# Patient Record
Sex: Male | Born: 1935 | Race: White | Hispanic: No | Marital: Married | Smoking: Former smoker
Health system: Southern US, Community
[De-identification: ages and names within clinical notes are randomized; demographics above are authoritative.]

## PROBLEM LIST (undated history)

## (undated) DIAGNOSIS — E119 Type 2 diabetes mellitus without complications: Secondary | ICD-10-CM

## (undated) DIAGNOSIS — F0390 Unspecified dementia without behavioral disturbance: Secondary | ICD-10-CM

## (undated) DIAGNOSIS — R001 Bradycardia, unspecified: Secondary | ICD-10-CM

## (undated) HISTORY — PX: PACEMAKER INSERTION: SHX728

## (undated) HISTORY — PX: CORONARY ARTERY BYPASS GRAFT: SHX141

---

## 2010-08-09 ENCOUNTER — Ambulatory Visit: Payer: Self-pay | Admitting: Cardiology

## 2010-08-12 ENCOUNTER — Ambulatory Visit: Payer: Self-pay | Admitting: Cardiology

## 2011-07-26 IMAGING — CR DG CHEST 2V
1 series · 2 of 2 positions shown · non-contrast
Comparison: none

REASON FOR EXAM: cad
COMMENTS:

[Series 1: view not recorded · 0.17mm/px · 2 of 2 slices shown]
[im 1/2]
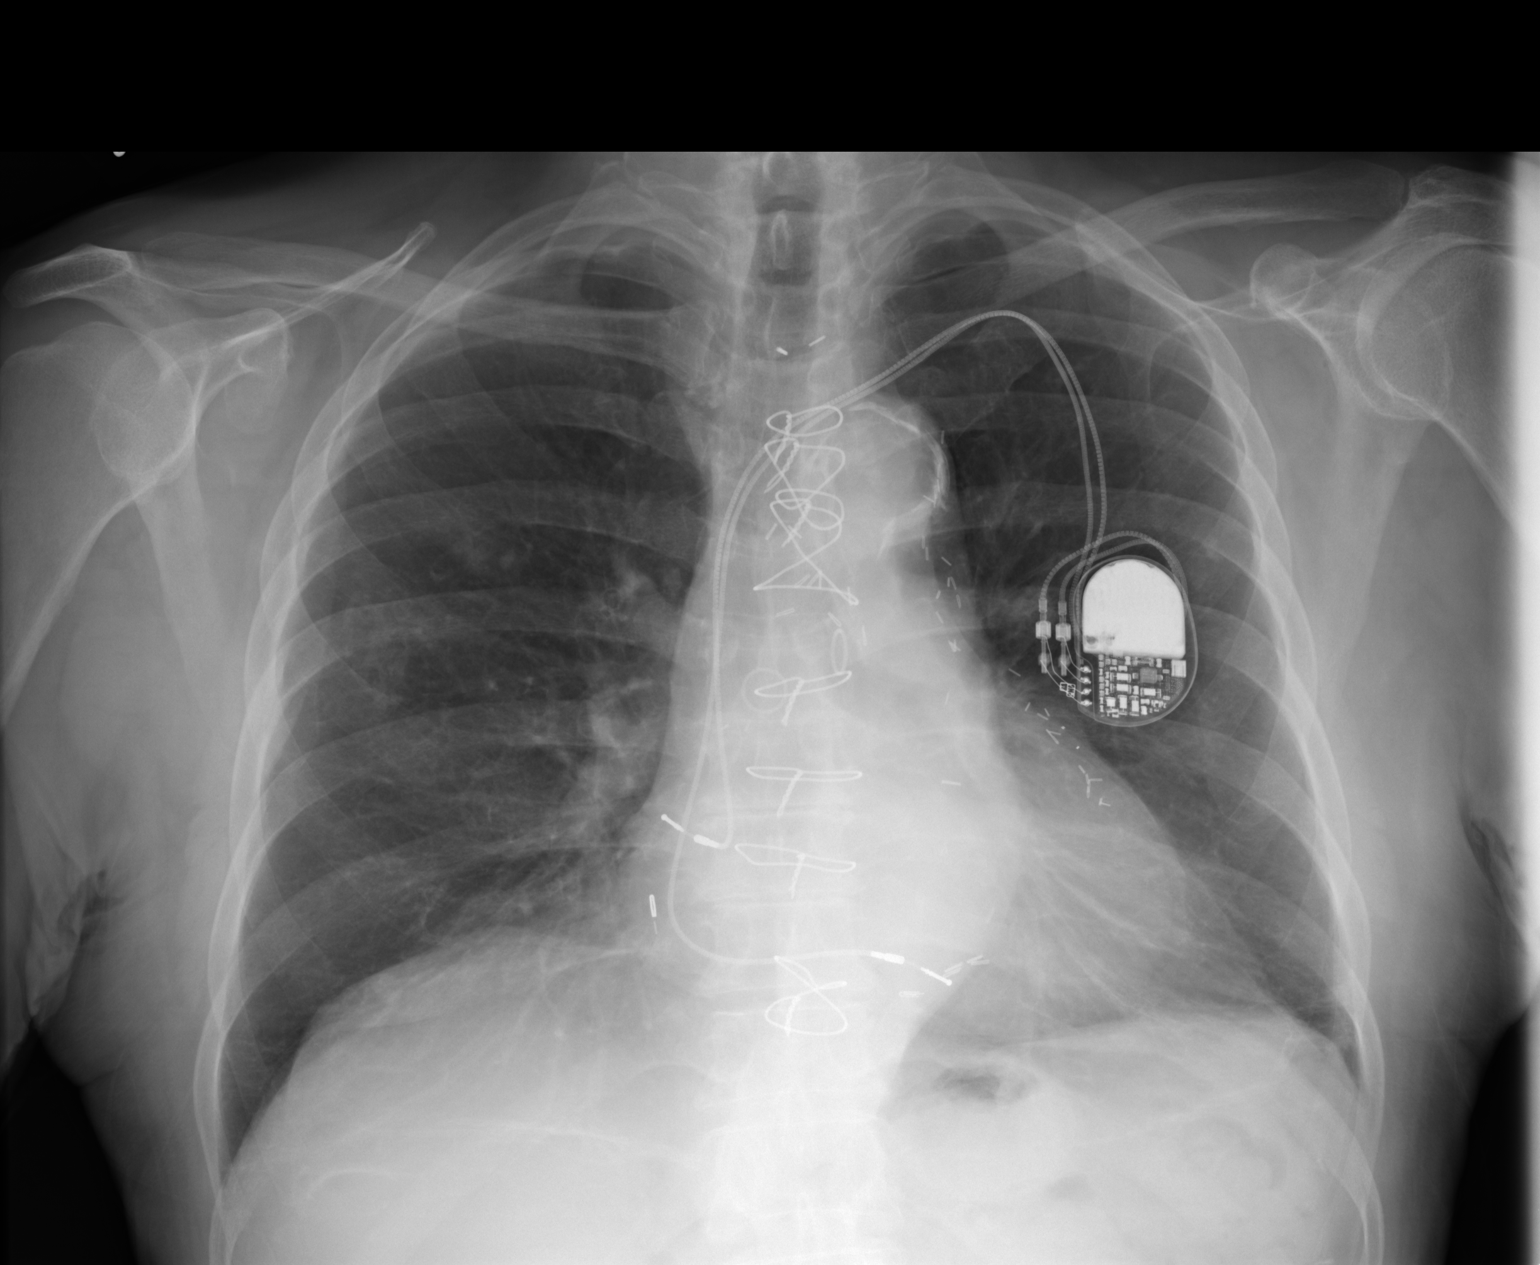
[im 2/2]
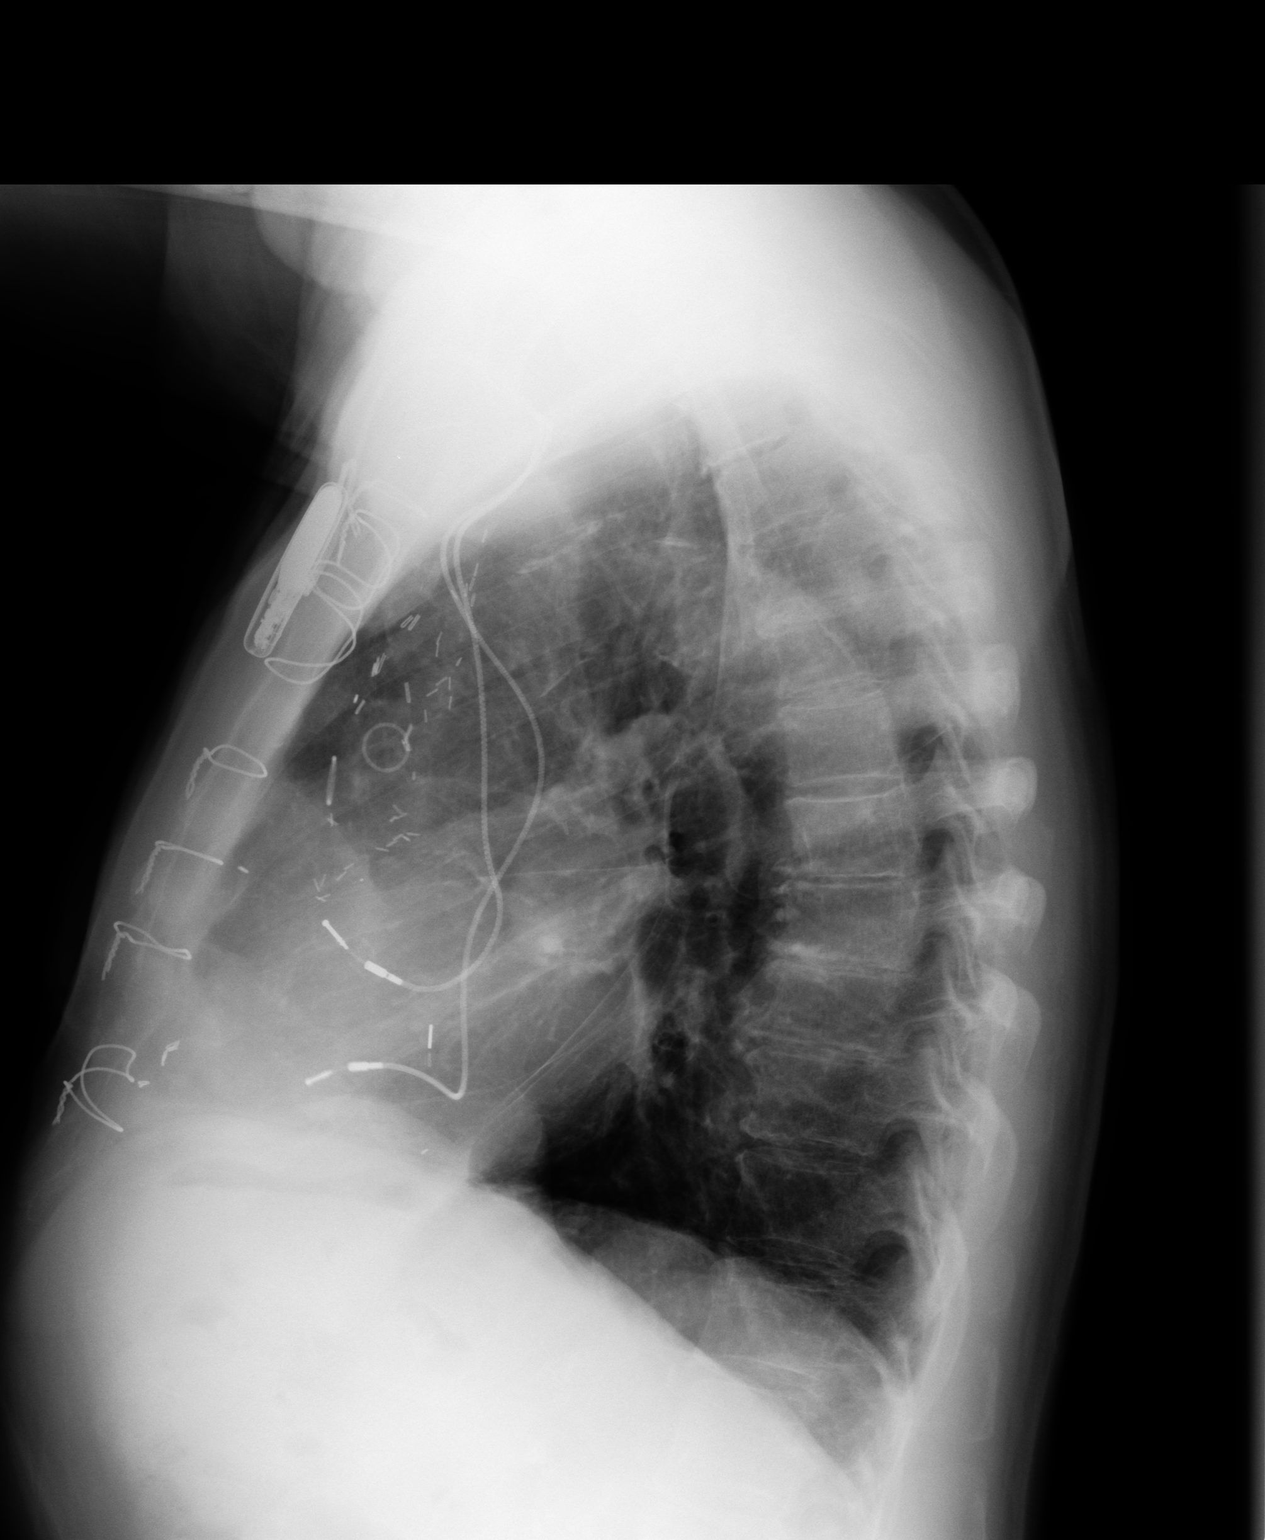

[2 of 2 positions shown; findings below may reference images not displayed]

PROCEDURE:     DXR - DXR CHEST PA (OR AP) AND LATERAL  - August 09, 2010  [DATE]

RESULT:     A left-sided pectoralis pacing unit is identified with lead tip
projecting in the region of the right ventricle and right atrium. Linear
areas of increased density project within the left lung base likely
representing scar or atelectasis. No focal regions of consolidation or focal
infiltrates appreciated. There is no evidence of pneumothorax. Ill-defined
area of vague increased density projects in the right midlung along the
periphery. This may represent pleural plaque disease.
IMPRESSION: 1. No evidence of a pneumothorax status post pacemaker exchange.
2. Possibly pleural plaque disease in the right hemithorax.
3. Discoid atelectasis versus scarring left lung base.

## 2014-01-18 ENCOUNTER — Emergency Department: Payer: Self-pay | Admitting: Emergency Medicine

## 2015-05-21 ENCOUNTER — Emergency Department: Payer: PPO

## 2015-05-21 ENCOUNTER — Emergency Department
Admission: EM | Admit: 2015-05-21 | Discharge: 2015-05-21 | Disposition: A | Discharge status: Home / Self Care | Payer: PPO | Attending: Emergency Medicine | Admitting: Emergency Medicine

## 2015-05-21 ENCOUNTER — Encounter: Payer: Self-pay | Admitting: Emergency Medicine

## 2015-05-21 DIAGNOSIS — Z880 Allergy status to penicillin: Secondary | ICD-10-CM | POA: Insufficient documentation

## 2015-05-21 DIAGNOSIS — S61451A Open bite of right hand, initial encounter: Secondary | ICD-10-CM | POA: Insufficient documentation

## 2015-05-21 DIAGNOSIS — E119 Type 2 diabetes mellitus without complications: Secondary | ICD-10-CM | POA: Diagnosis not present

## 2015-05-21 DIAGNOSIS — Y9389 Activity, other specified: Secondary | ICD-10-CM | POA: Diagnosis not present

## 2015-05-21 DIAGNOSIS — Y92009 Unspecified place in unspecified non-institutional (private) residence as the place of occurrence of the external cause: Secondary | ICD-10-CM | POA: Diagnosis not present

## 2015-05-21 DIAGNOSIS — W540XXA Bitten by dog, initial encounter: Secondary | ICD-10-CM | POA: Insufficient documentation

## 2015-05-21 DIAGNOSIS — S61411A Laceration without foreign body of right hand, initial encounter: Secondary | ICD-10-CM | POA: Insufficient documentation

## 2015-05-21 DIAGNOSIS — Z87891 Personal history of nicotine dependence: Secondary | ICD-10-CM | POA: Diagnosis not present

## 2015-05-21 DIAGNOSIS — Y998 Other external cause status: Secondary | ICD-10-CM | POA: Insufficient documentation

## 2015-05-21 DIAGNOSIS — M7989 Other specified soft tissue disorders: Secondary | ICD-10-CM | POA: Diagnosis not present

## 2015-05-21 DIAGNOSIS — Z23 Encounter for immunization: Secondary | ICD-10-CM | POA: Insufficient documentation

## 2015-05-21 HISTORY — DX: Unspecified dementia, unspecified severity, without behavioral disturbance, psychotic disturbance, mood disturbance, and anxiety: F03.90

## 2015-05-21 HISTORY — DX: Bradycardia, unspecified: R00.1

## 2015-05-21 HISTORY — DX: Type 2 diabetes mellitus without complications: E11.9

## 2015-05-21 MED ORDER — TETANUS-DIPHTH-ACELL PERTUSSIS 5-2.5-18.5 LF-MCG/0.5 IM SUSP
0.5000 mL | Freq: Once | INTRAMUSCULAR | Status: AC
  Administered 2015-05-21: 0.5 mL via INTRAMUSCULAR
  Filled 2015-05-21: qty 0.5

## 2015-05-21 MED ORDER — CLINDAMYCIN HCL 150 MG PO CAPS: 450.0000 mg | ORAL_CAPSULE | Freq: Three times a day (TID) | ORAL | Status: AC

## 2015-05-21 MED ORDER — DOXYCYCLINE HYCLATE 100 MG PO TABS
100.0000 mg | ORAL_TABLET | Freq: Once | ORAL | Status: AC
  Administered 2015-05-21: 100 mg via ORAL
  Filled 2015-05-21: qty 1

## 2015-05-21 MED ORDER — DOXYCYCLINE HYCLATE 100 MG PO TABS: 100.0000 mg | ORAL_TABLET | Freq: Once | ORAL | Status: AC

## 2015-05-21 MED ORDER — CLINDAMYCIN HCL 150 MG PO CAPS
450.0000 mg | ORAL_CAPSULE | Freq: Once | ORAL | Status: AC
  Administered 2015-05-21: 450 mg via ORAL
  Filled 2015-05-21: qty 3

## 2015-05-21 NOTE — Discharge Instructions (Signed)
Animal Bite °Animal bites can range from mild to serious. An animal bite can result in a scratch on the skin, a deep open cut, a puncture of the skin, a crush injury, or tearing away of the skin or a body part. A small bite from a house pet will usually not cause serious problems. However, some animal bites can become infected or injure a bone or other tissue.  °Bites from certain animals can be more dangerous because of the risk of spreading rabies, which is a serious viral infection. This risk is higher with bites from stray animals or wild animals, such as raccoons, foxes, skunks, and bats. Dogs are responsible for most animal bites. Children are bitten more often than adults. °SYMPTOMS  °Common symptoms of an animal bite include:  °· Pain.   °· Bleeding.   °· Swelling.   °· Bruising.   °DIAGNOSIS  °This condition may be diagnosed based on a physical exam and medical history. Your health care provider will examine the wound and ask for details about the animal and how the bite happened. You may also have tests, such as:  °· Blood tests to check for infection or to determine if surgery is needed. °· X-rays to check for damage to bones or joints. °· Culture test. This uses a sample of fluid from the wound to check for infection. °TREATMENT  °Treatment varies depending on the location and type of animal bite and your medical history. Treatment may include:  °· Wound care. This often includes cleaning the wound, flushing the wound with saline solution, and applying a bandage (dressing). Sometimes, the wound is left open to heal because of the high risk of infection. However, in some cases, the wound may be closed with stitches (sutures), staples, skin glue, or adhesive strips.   °· Antibiotic medicine.   °· Tetanus shot.   °· Rabies treatment if the animal could have rabies.   °In some cases, bites that have become infected may require IV antibiotics and surgical treatment in the hospital.  °HOME CARE  INSTRUCTIONS °Wound Care  °· Follow instructions from your health care provider about how to take care of your wound. Make sure you: °¨ Wash your hands with soap and water before you change your dressing. If soap and water are not available, use hand sanitizer. °¨ Change your dressing as told by your health care provider. °¨ Leave sutures, skin glue, or adhesive strips in place. These skin closures may need to be in place for 2 weeks or longer. If adhesive strip edges start to loosen and curl up, you may trim the loose edges. Do not remove adhesive strips completely unless your health care provider tells you to do that. °· Check your wound every day for signs of infection. Watch for:   °¨  Increasing redness, swelling, or pain.   °¨  Fluid, blood, or pus.   °General Instructions  °· Take or apply over-the-counter and prescription medicines only as told by your health care provider.   °· If you were prescribed an antibiotic, take or apply it as told by your health care provider. Do not stop using the antibiotic even if your condition improves.   °· Keep the injured area raised (elevated) above the level of your heart while you are sitting or lying down, if this is possible.   °· If directed, apply ice to the injured area.   °¨  Put ice in a plastic bag.   °¨  Place a towel between your skin and the bag.   °¨  Leave the ice on for 20 minutes, 2-3 times per day.   °·   Keep all follow-up visits as told by your health care provider. This is important.   °SEEK MEDICAL CARE IF: °· You have increasing redness, swelling, or pain at the site of your wound.   °· You have a general feeling of sickness (malaise).   °· You feel nauseous or you vomit.   °· You have pain that does not get better.   °SEEK IMMEDIATE MEDICAL CARE IF: °· You have a red streak extending away from your wound.   °· You have fluid, blood, or pus coming from your wound.   °· You have a fever or chills.   °· You have trouble moving your injured area.   °· You  have numbness or tingling extending beyond the wound. °  °This information is not intended to replace advice given to you by your health care provider. Make sure you discuss any questions you have with your health care provider. °  °Document Released: 01/18/2011 Document Revised: 01/21/2015 Document Reviewed: 09/17/2014 °Elsevier Interactive Patient Education ©2016 [employer] ° °Wound Care °Taking care of your wound properly can help to prevent pain and infection. It can also help your wound to heal more quickly.  °HOW TO CARE FOR YOUR WOUND  °· Take or apply over-the-counter and prescription medicines only as told by your health care provider. °· If you were prescribed antibiotic medicine, take or apply it as told by your health care provider. Do not stop using the antibiotic even if your condition improves. °· Clean the wound each day or as told by your health care provider. °¨ Wash the wound with mild soap and water. °¨ Rinse the wound with water to remove all soap. °¨ Pat the wound dry with a clean towel. Do not rub it. °· There are many different ways to close and cover a wound. For example, a wound can be covered with stitches (sutures), skin glue, or adhesive strips. Follow instructions from your health care provider about: °¨ How to take care of your wound. °¨ When and how you should change your bandage (dressing). °¨ When you should remove your dressing. °¨ Removing whatever was used to close your wound. °· Check your wound every day for signs of infection. Watch for: °¨ Redness, swelling, or pain. °¨ Fluid, blood, or pus. °· Keep the dressing dry until your health care provider says it can be removed. Do not take baths, swim, use a hot tub, or do anything that would put your wound underwater until your health care provider approves. °· Raise (elevate) the injured area above the level of your heart while you are sitting or lying down. °· Do not scratch or pick at the wound. °· Keep all follow-up visits  as told by your health care provider. This is important. °SEEK MEDICAL CARE IF: °· You received a tetanus shot and you have swelling, severe pain, redness, or bleeding at the injection site. °· You have a fever. °· Your pain is not controlled with medicine. °· You have increased redness, swelling, or pain at the site of your wound. °· You have fluid, blood, or pus coming from your wound. °· You notice a bad smell coming from your wound or your dressing. °SEEK IMMEDIATE MEDICAL CARE IF: °· You have a red streak going away from your wound. °  °This information is not intended to replace advice given to you by your health care provider. Make sure you discuss any questions you have with your health care provider. °  °Document Released: 02/09/2008 Document Revised: 09/16/2014 Document Reviewed: 04/28/2014 °[employer]vier Interactive   Patient Education ©2016 [employer] ° °

## 2015-05-21 NOTE — ED Notes (Addendum)
Pt reports was playing with his dog and his dog bit his right hand.  Step son reports he reported this to [place] police last night because dog has bitten before. Large irregular laceration to right hand with swelling. Full sensation. Pt able to move fingers. Is diabetic.

## 2015-05-21 NOTE — ED Provider Notes (Signed)
[facility] Emergency Department Provider Note  ____________________________________________  Time seen: 4:15pm   I have reviewed the triage vital signs and the nursing notes.   HISTORY  Chief Complaint Animal Bite    HPI Mike Stone is a 82 y.o. male who c/o dog bite to right hand.  He was bitten by his family dog yesterday, which has bitten many times in the past.  They plan to get rid of the dog at this point, and it has been reported to [employer] yesterday.  The dog lives in the house at all times, uses leashes, has not been in any altercations with or killed any other animals recently.  No unusual behavior noted recently with the dog.    Pt denies any other new symptoms.  No fever or vomiting.      Past Medical History  Diagnosis Date  . Bradycardia     s/p pacemaker  . Diabetes mellitus without complication (HCC)   . Dementia      There are no active problems to display for this patient.    Past Surgical History  Procedure Laterality Date  . Coronary artery bypass graft    . Pacemaker insertion       Current Outpatient Rx  Name  Route  Sig  Dispense  Refill  . clindamycin (CLEOCIN) 150 MG capsule   Oral   Take 3 capsules (450 mg total) by mouth 3 (three) times daily.   90 capsule   0   . doxycycline (VIBRA-TABS) 100 MG tablet   Oral   Take 1 tablet (100 mg total) by mouth once.   20 tablet   0      Allergies Penicillins   History reviewed. No pertinent family history.  Social History Social History  Substance Use Topics  . Smoking status: Former Games developer  . Smokeless tobacco: None  . Alcohol Use: No    Review of Systems  Constitutional:   No fever or chills. No weight changes Eyes:   No blurry vision or double vision.  ENT:   No sore throat. Cardiovascular:   No chest pain. Respiratory:   No dyspnea or cough. Gastrointestinal:   Negative for abdominal pain, vomiting and diarrhea.  No BRBPR or  melena. Genitourinary:   Negative for dysuria, urinary retention, bloody urine, or difficulty urinating. Musculoskeletal:   Right hand injury as above.  Skin:   Negative for rash. Neurological:   Negative for headaches, focal weakness or numbness. Psychiatric:  No anxiety or depression.   Endocrine:  No hot/cold intolerance, changes in energy, or sleep difficulty.  10-point ROS otherwise negative.  ____________________________________________   PHYSICAL EXAM:  VITAL SIGNS: ED Triage Vitals  Enc Vitals Group     BP 05/21/15 1609 151/65 mmHg     Pulse Rate 05/21/15 1609 70     Resp 05/21/15 1609 16     Temp 05/21/15 1609 97.4 F (36.3 C)     Temp src --      SpO2 05/21/15 1609 100 %     Weight 05/21/15 1609 142 lb (64.411 kg)     Height 05/21/15 1609 5\' 4"  (1.626 m)     Head Cir --      Peak Flow --      Pain Score 05/21/15 1606 8     Pain Loc --      Pain Edu? --      Excl. in GC? --     Vital signs reviewed, nursing assessments  reviewed.   Constitutional:   Alert and oriented. Well appearing and in no distress. Musculoskeletal:   Dorsal right hand lacerations in large "v" shape approx 3x4cm over the right metacarpals.  Small laceration near the webspace between the second and third digits of the right hand. Slight edema but no erythema or purulent drainage. No lymphangitis. No crepitus or abscess formation. Neurologic:   Normal speech and language.  CN 2-10 normal. Motor grossly intact. No pronator drift.  Normal gait. No gross focal neurologic deficits are appreciated.  Skin:    Skin is warm, dry and intact. No rash noted.  No petechiae, purpura, or bullae. Psychiatric:   Mood and affect are normal. Speech and behavior are normal. Patient exhibits appropriate insight and judgment.  ____________________________________________    LABS (pertinent positives/negatives) (all labs ordered are listed, but only abnormal results are displayed) Labs Reviewed - No data  to display ____________________________________________   EKG    ____________________________________________    RADIOLOGY  X-ray right hand unremarkable  ____________________________________________   PROCEDURES Wound care provided with sterile saline and iodine soak and manual irrigation of the wound under pressure.  ____________________________________________   INITIAL IMPRESSION / ASSESSMENT AND PLAN / ED COURSE  Pertinent labs & imaging results that were available during my care of the patient were reviewed by me and considered in my medical decision making (see chart for details).  Patient presents with dog bite to right hand. Has a history of diabetes which is controlled with oral hypoglycemics. Vital signs unremarkable at this time and no evidence of cellulitis necrotizing fasciitis osteomyelitis or abscess at this time. Patient given a tetanus shot and started on doxycycline and clindamycin due to a reported penicillin allergy. Due to the high risk wound we will keep him on these medications for 10 days and have him follow-up with orthopedics. No evidence of any fracture or dislocation or tendon injury. Patient counseled that due to the high risk of infection we will have to leave the wound open and allow it to heal by secondary intention. We'll provide the patient with some basic wound care teaching and supplies for home use.     ____________________________________________   FINAL CLINICAL IMPRESSION(S) / ED DIAGNOSES  Final diagnoses:  Dog bite of right hand, initial encounter      Sharman CheekPhillip Doshia Dalia, MD 05/21/15 1734

## 2015-07-16 DIAGNOSIS — I2510 Atherosclerotic heart disease of native coronary artery without angina pectoris: Secondary | ICD-10-CM | POA: Diagnosis not present

## 2015-07-16 DIAGNOSIS — R7989 Other specified abnormal findings of blood chemistry: Secondary | ICD-10-CM | POA: Diagnosis not present

## 2015-07-16 DIAGNOSIS — E782 Mixed hyperlipidemia: Secondary | ICD-10-CM | POA: Diagnosis not present

## 2015-07-16 DIAGNOSIS — Z79899 Other long term (current) drug therapy: Secondary | ICD-10-CM | POA: Diagnosis not present

## 2015-07-16 DIAGNOSIS — F0390 Unspecified dementia without behavioral disturbance: Secondary | ICD-10-CM | POA: Diagnosis not present

## 2015-07-16 DIAGNOSIS — E119 Type 2 diabetes mellitus without complications: Secondary | ICD-10-CM | POA: Diagnosis not present

## 2015-09-10 DIAGNOSIS — I214 Non-ST elevation (NSTEMI) myocardial infarction: Secondary | ICD-10-CM | POA: Diagnosis not present

## 2015-09-10 DIAGNOSIS — I441 Atrioventricular block, second degree: Secondary | ICD-10-CM | POA: Diagnosis not present

## 2016-01-21 DIAGNOSIS — E119 Type 2 diabetes mellitus without complications: Secondary | ICD-10-CM | POA: Diagnosis not present

## 2016-01-21 DIAGNOSIS — Z79899 Other long term (current) drug therapy: Secondary | ICD-10-CM | POA: Diagnosis not present

## 2016-01-21 DIAGNOSIS — I2510 Atherosclerotic heart disease of native coronary artery without angina pectoris: Secondary | ICD-10-CM | POA: Diagnosis not present

## 2016-03-03 DIAGNOSIS — E782 Mixed hyperlipidemia: Secondary | ICD-10-CM | POA: Diagnosis not present

## 2016-03-03 DIAGNOSIS — I441 Atrioventricular block, second degree: Secondary | ICD-10-CM | POA: Diagnosis not present

## 2016-03-03 DIAGNOSIS — E119 Type 2 diabetes mellitus without complications: Secondary | ICD-10-CM | POA: Diagnosis not present

## 2016-03-03 DIAGNOSIS — I2510 Atherosclerotic heart disease of native coronary artery without angina pectoris: Secondary | ICD-10-CM | POA: Diagnosis not present

## 2016-05-06 IMAGING — CR DG HAND 2V*R*
1 series · 2 of 2 positions shown · non-contrast
Comparison: None.

CLINICAL DATA: Dog bite to the right hand last evening.

EXAM:
RIGHT HAND - 2 VIEW

[Series 1: pa · 0.17mm/px · 2 of 2 slices shown]
[im 1/2]
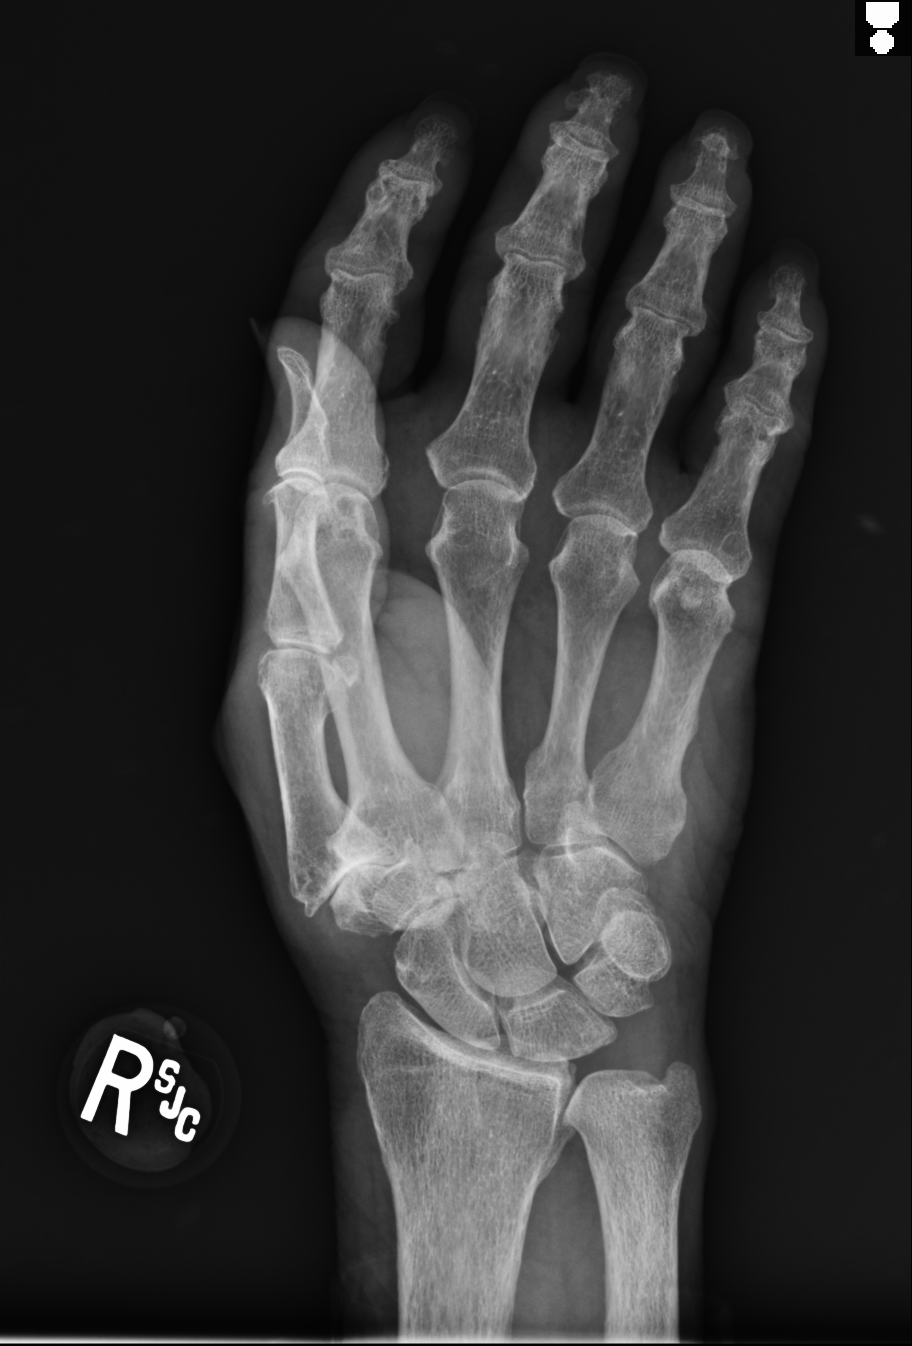
[im 2/2]
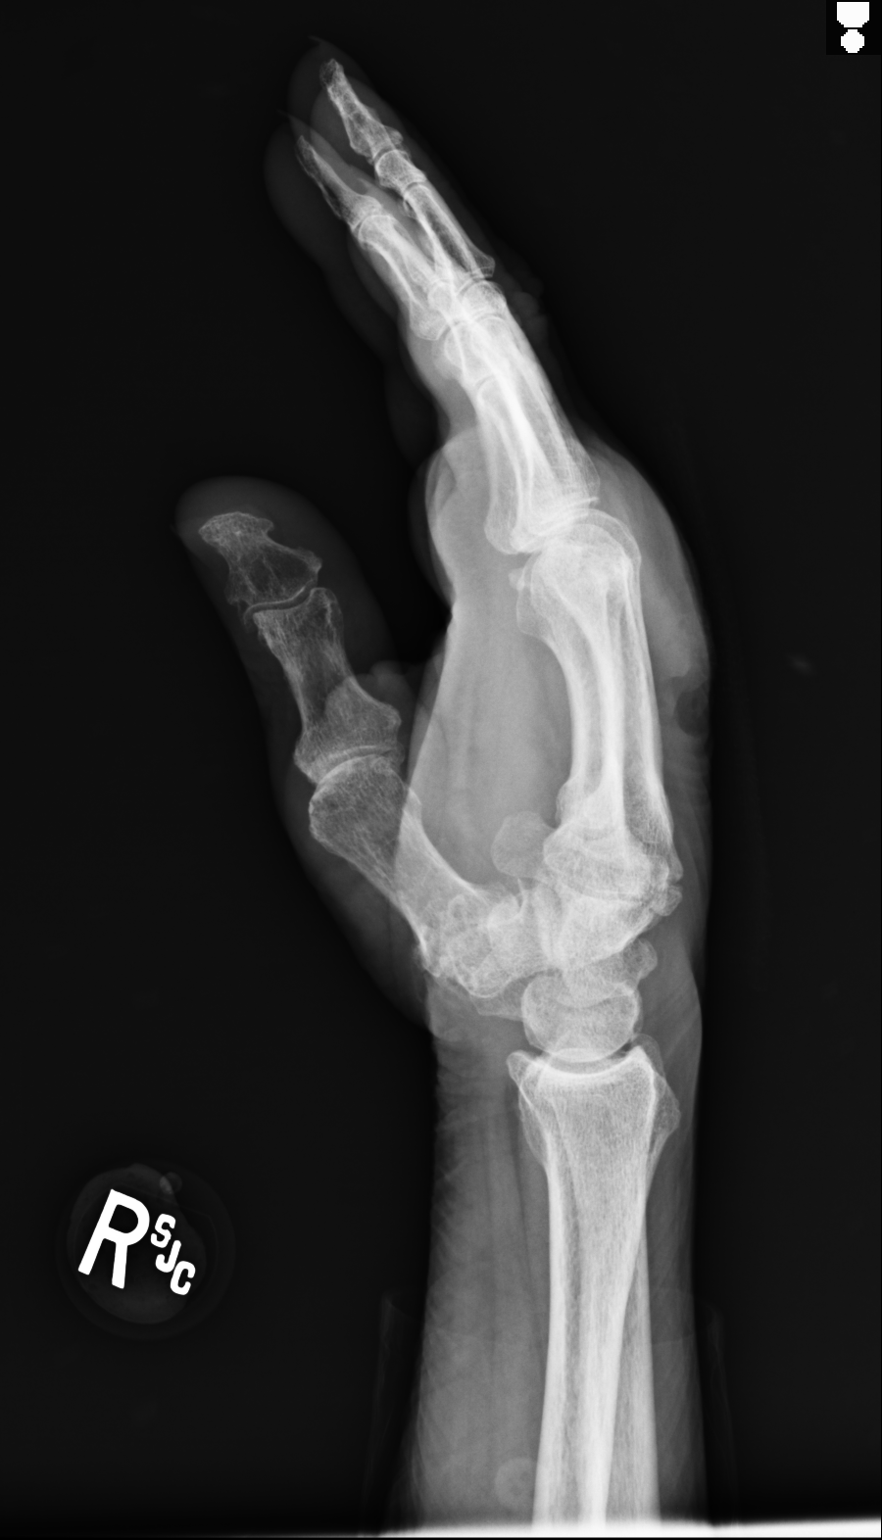

[2 of 2 positions shown; findings below may reference images not displayed]

FINDINGS: Soft tissue defect noted on the dorsum of the hand with extensive
soft tissue swelling. No radiopaque foreign body or acute fracture.
Moderate to advanced degenerative changes involving the hand and
wrist.
IMPRESSION: No fracture or radiopaque foreign body.

## 2016-05-19 DIAGNOSIS — R05 Cough: Secondary | ICD-10-CM | POA: Diagnosis not present

## 2016-05-19 DIAGNOSIS — J209 Acute bronchitis, unspecified: Secondary | ICD-10-CM | POA: Diagnosis not present

## 2016-07-21 DIAGNOSIS — E782 Mixed hyperlipidemia: Secondary | ICD-10-CM | POA: Diagnosis not present

## 2016-07-21 DIAGNOSIS — Z79899 Other long term (current) drug therapy: Secondary | ICD-10-CM | POA: Diagnosis not present

## 2016-07-21 DIAGNOSIS — R946 Abnormal results of thyroid function studies: Secondary | ICD-10-CM | POA: Diagnosis not present

## 2016-07-21 DIAGNOSIS — E119 Type 2 diabetes mellitus without complications: Secondary | ICD-10-CM | POA: Diagnosis not present

## 2016-08-18 DIAGNOSIS — I442 Atrioventricular block, complete: Secondary | ICD-10-CM | POA: Diagnosis not present

## 2016-08-18 DIAGNOSIS — I441 Atrioventricular block, second degree: Secondary | ICD-10-CM | POA: Diagnosis not present

## 2016-08-18 DIAGNOSIS — I2510 Atherosclerotic heart disease of native coronary artery without angina pectoris: Secondary | ICD-10-CM | POA: Diagnosis not present

## 2017-01-19 DIAGNOSIS — Z79899 Other long term (current) drug therapy: Secondary | ICD-10-CM | POA: Diagnosis not present

## 2017-01-19 DIAGNOSIS — E119 Type 2 diabetes mellitus without complications: Secondary | ICD-10-CM | POA: Diagnosis not present

## 2017-01-25 DIAGNOSIS — F0390 Unspecified dementia without behavioral disturbance: Secondary | ICD-10-CM | POA: Diagnosis not present

## 2017-01-25 DIAGNOSIS — I2510 Atherosclerotic heart disease of native coronary artery without angina pectoris: Secondary | ICD-10-CM | POA: Diagnosis not present

## 2017-01-25 DIAGNOSIS — E782 Mixed hyperlipidemia: Secondary | ICD-10-CM | POA: Diagnosis not present

## 2017-01-25 DIAGNOSIS — E119 Type 2 diabetes mellitus without complications: Secondary | ICD-10-CM | POA: Diagnosis not present

## 2017-03-02 DIAGNOSIS — I214 Non-ST elevation (NSTEMI) myocardial infarction: Secondary | ICD-10-CM | POA: Diagnosis not present

## 2017-03-02 DIAGNOSIS — R2681 Unsteadiness on feet: Secondary | ICD-10-CM | POA: Diagnosis not present

## 2017-03-02 DIAGNOSIS — E782 Mixed hyperlipidemia: Secondary | ICD-10-CM | POA: Diagnosis not present

## 2017-03-02 DIAGNOSIS — I2510 Atherosclerotic heart disease of native coronary artery without angina pectoris: Secondary | ICD-10-CM | POA: Diagnosis not present

## 2017-03-02 DIAGNOSIS — I442 Atrioventricular block, complete: Secondary | ICD-10-CM | POA: Diagnosis not present

## 2017-07-26 DIAGNOSIS — Z0000 Encounter for general adult medical examination without abnormal findings: Secondary | ICD-10-CM | POA: Diagnosis not present

## 2017-07-26 DIAGNOSIS — F0390 Unspecified dementia without behavioral disturbance: Secondary | ICD-10-CM | POA: Diagnosis not present

## 2017-07-26 DIAGNOSIS — D649 Anemia, unspecified: Secondary | ICD-10-CM | POA: Diagnosis not present

## 2017-07-26 DIAGNOSIS — E119 Type 2 diabetes mellitus without complications: Secondary | ICD-10-CM | POA: Diagnosis not present

## 2017-07-26 DIAGNOSIS — E782 Mixed hyperlipidemia: Secondary | ICD-10-CM | POA: Diagnosis not present

## 2017-07-26 DIAGNOSIS — I2510 Atherosclerotic heart disease of native coronary artery without angina pectoris: Secondary | ICD-10-CM | POA: Diagnosis not present

## 2017-09-12 DIAGNOSIS — E782 Mixed hyperlipidemia: Secondary | ICD-10-CM | POA: Diagnosis not present

## 2017-09-12 DIAGNOSIS — I2510 Atherosclerotic heart disease of native coronary artery without angina pectoris: Secondary | ICD-10-CM | POA: Diagnosis not present

## 2017-09-12 DIAGNOSIS — Z950 Presence of cardiac pacemaker: Secondary | ICD-10-CM | POA: Diagnosis not present

## 2017-09-12 DIAGNOSIS — E119 Type 2 diabetes mellitus without complications: Secondary | ICD-10-CM | POA: Diagnosis not present

## 2017-09-12 DIAGNOSIS — I214 Non-ST elevation (NSTEMI) myocardial infarction: Secondary | ICD-10-CM | POA: Diagnosis not present

## 2017-09-12 DIAGNOSIS — I442 Atrioventricular block, complete: Secondary | ICD-10-CM | POA: Diagnosis not present

## 2018-02-13 DIAGNOSIS — Z79899 Other long term (current) drug therapy: Secondary | ICD-10-CM | POA: Diagnosis not present

## 2018-02-13 DIAGNOSIS — I2510 Atherosclerotic heart disease of native coronary artery without angina pectoris: Secondary | ICD-10-CM | POA: Diagnosis not present

## 2018-02-13 DIAGNOSIS — E119 Type 2 diabetes mellitus without complications: Secondary | ICD-10-CM | POA: Diagnosis not present

## 2018-02-22 DIAGNOSIS — E119 Type 2 diabetes mellitus without complications: Secondary | ICD-10-CM | POA: Diagnosis not present

## 2018-02-22 DIAGNOSIS — I442 Atrioventricular block, complete: Secondary | ICD-10-CM | POA: Diagnosis not present

## 2018-02-22 DIAGNOSIS — Z8669 Personal history of other diseases of the nervous system and sense organs: Secondary | ICD-10-CM | POA: Diagnosis not present

## 2018-02-22 DIAGNOSIS — I2510 Atherosclerotic heart disease of native coronary artery without angina pectoris: Secondary | ICD-10-CM | POA: Diagnosis not present

## 2018-02-22 DIAGNOSIS — Z45018 Encounter for adjustment and management of other part of cardiac pacemaker: Secondary | ICD-10-CM | POA: Diagnosis not present

## 2018-09-11 DIAGNOSIS — I442 Atrioventricular block, complete: Secondary | ICD-10-CM | POA: Diagnosis not present

## 2018-12-25 DIAGNOSIS — I442 Atrioventricular block, complete: Secondary | ICD-10-CM | POA: Diagnosis not present

## 2019-04-30 DIAGNOSIS — I442 Atrioventricular block, complete: Secondary | ICD-10-CM | POA: Diagnosis not present

## 2019-08-06 DIAGNOSIS — I442 Atrioventricular block, complete: Secondary | ICD-10-CM | POA: Diagnosis not present

## 2019-11-26 DIAGNOSIS — I442 Atrioventricular block, complete: Secondary | ICD-10-CM | POA: Diagnosis not present

## 2020-03-03 DIAGNOSIS — I442 Atrioventricular block, complete: Secondary | ICD-10-CM | POA: Diagnosis not present

## 2020-09-01 DIAGNOSIS — I442 Atrioventricular block, complete: Secondary | ICD-10-CM | POA: Diagnosis not present

## 2020-12-01 DIAGNOSIS — I442 Atrioventricular block, complete: Secondary | ICD-10-CM | POA: Diagnosis not present

## 2021-06-29 DIAGNOSIS — I442 Atrioventricular block, complete: Secondary | ICD-10-CM | POA: Diagnosis not present

## 2022-04-15 DEATH — deceased
